# Patient Record
Sex: Male | Born: 1960 | Race: Black or African American | Hispanic: No
Health system: Southern US, Community
[De-identification: ages and names within clinical notes are randomized; demographics above are authoritative.]

---

## 2019-11-17 ENCOUNTER — Emergency Department (HOSPITAL_COMMUNITY): Payer: Self-pay

## 2019-11-17 ENCOUNTER — Encounter (HOSPITAL_COMMUNITY): Payer: Self-pay | Admitting: Emergency Medicine

## 2019-11-17 ENCOUNTER — Emergency Department (HOSPITAL_COMMUNITY)
Admission: EM | Admit: 2019-11-17 | Discharge: 2019-11-17 | Disposition: A | Discharge status: Home / Self Care | Payer: Self-pay | Attending: Emergency Medicine | Admitting: Emergency Medicine

## 2019-11-17 DIAGNOSIS — S2020XA Contusion of thorax, unspecified, initial encounter: Secondary | ICD-10-CM | POA: Diagnosis not present

## 2019-11-17 DIAGNOSIS — Y939 Activity, unspecified: Secondary | ICD-10-CM | POA: Diagnosis not present

## 2019-11-17 DIAGNOSIS — T1490XA Injury, unspecified, initial encounter: Secondary | ICD-10-CM

## 2019-11-17 DIAGNOSIS — M79601 Pain in right arm: Secondary | ICD-10-CM | POA: Insufficient documentation

## 2019-11-17 DIAGNOSIS — R519 Headache, unspecified: Secondary | ICD-10-CM | POA: Diagnosis not present

## 2019-11-17 DIAGNOSIS — M542 Cervicalgia: Secondary | ICD-10-CM | POA: Insufficient documentation

## 2019-11-17 DIAGNOSIS — Y999 Unspecified external cause status: Secondary | ICD-10-CM | POA: Insufficient documentation

## 2019-11-17 DIAGNOSIS — R109 Unspecified abdominal pain: Secondary | ICD-10-CM | POA: Insufficient documentation

## 2019-11-17 DIAGNOSIS — S0990XA Unspecified injury of head, initial encounter: Secondary | ICD-10-CM | POA: Insufficient documentation

## 2019-11-17 DIAGNOSIS — R911 Solitary pulmonary nodule: Secondary | ICD-10-CM

## 2019-11-17 DIAGNOSIS — S24109A Unspecified injury at unspecified level of thoracic spinal cord, initial encounter: Secondary | ICD-10-CM | POA: Diagnosis present

## 2019-11-17 DIAGNOSIS — Y92410 Unspecified street and highway as the place of occurrence of the external cause: Secondary | ICD-10-CM | POA: Diagnosis not present

## 2019-11-17 LAB — COMPREHENSIVE METABOLIC PANEL
ALT: 18 U/L (ref 0–44)
AST: 26 U/L (ref 15–41)
Albumin: 3.5 g/dL (ref 3.5–5.0)
Alkaline Phosphatase: 49 U/L (ref 38–126)
Anion gap: 10 (ref 5–15)
BUN: 9 mg/dL (ref 6–20)
CO2: 29 mmol/L (ref 22–32)
Calcium: 8.8 mg/dL — ABNORMAL LOW (ref 8.9–10.3)
Chloride: 102 mmol/L (ref 98–111)
Creatinine, Ser: 1.24 mg/dL (ref 0.61–1.24)
GFR calc Af Amer: >60 mL/min (ref >60)
GFR calc non Af Amer: >60 mL/min (ref >60)
Glucose, Bld: 129 mg/dL — ABNORMAL HIGH (ref 70–99)
Potassium: 3.9 mmol/L (ref 3.5–5.1)
Sodium: 141 mmol/L (ref 135–145)
Total Bilirubin: 1 mg/dL (ref 0.3–1.2)
Total Protein: 6.1 g/dL — ABNORMAL LOW (ref 6.5–8.1)

## 2019-11-17 LAB — I-STAT CHEM 8, ED
BUN: 10 mg/dL (ref 6–20)
Calcium, Ion: 1.12 mmol/L — ABNORMAL LOW (ref 1.15–1.40)
Chloride: 102 mmol/L (ref 98–111)
Creatinine, Ser: 1.2 mg/dL (ref 0.61–1.24)
Glucose, Bld: 130 mg/dL — ABNORMAL HIGH (ref 70–99)
HCT: 44 % (ref 39.0–52.0)
Hemoglobin: 15 g/dL (ref 13.0–17.0)
Potassium: 3.8 mmol/L (ref 3.5–5.1)
Sodium: 141 mmol/L (ref 135–145)
TCO2: 30 mmol/L (ref 22–32)

## 2019-11-17 LAB — CBC
HCT: 44.2 % (ref 39.0–52.0)
Hemoglobin: 14.4 g/dL (ref 13.0–17.0)
MCH: 30.6 pg (ref 26.0–34.0)
MCHC: 32.6 g/dL (ref 30.0–36.0)
MCV: 94 fL (ref 80.0–100.0)
Platelets: 271 10*3/uL (ref 150–400)
RBC: 4.7 MIL/uL (ref 4.22–5.81)
RDW: 13.1 % (ref 11.5–15.5)
WBC: 3.4 10*3/uL — ABNORMAL LOW (ref 4.0–10.5)
nRBC: 0 % (ref 0.0–0.2)

## 2019-11-17 LAB — SAMPLE TO BLOOD BANK

## 2019-11-17 LAB — PROTIME-INR
INR: 1.1 (ref 0.8–1.2)
Prothrombin Time: 14.5 seconds (ref 11.4–15.2)

## 2019-11-17 LAB — ETHANOL: Alcohol, Ethyl (B): <10 mg/dL (ref <10)

## 2019-11-17 MED ORDER — HYDROCODONE-ACETAMINOPHEN 5-325 MG PO TABS
1.0000 | ORAL_TABLET | ORAL | Status: AC
  Administered 2019-11-17: 1 via ORAL
  Filled 2019-11-17: qty 1

## 2019-11-17 MED ORDER — ONDANSETRON HCL 4 MG/2ML IJ SOLN
4.0000 mg | Freq: Once | INTRAMUSCULAR | Status: AC
  Administered 2019-11-17: 4 mg via INTRAVENOUS
  Filled 2019-11-17: qty 2

## 2019-11-17 MED ORDER — IOHEXOL 300 MG/ML  SOLN
100.0000 mL | Freq: Once | INTRAMUSCULAR | Status: AC | PRN
  Administered 2019-11-17: 100 mL via INTRAVENOUS

## 2019-11-17 MED ORDER — HYDROCODONE-ACETAMINOPHEN 5-325 MG PO TABS: 1.0000 | ORAL_TABLET | ORAL | 0 refills | Status: AC | PRN

## 2019-11-17 MED ORDER — MORPHINE SULFATE (PF) 4 MG/ML IV SOLN
4.0000 mg | Freq: Once | INTRAVENOUS | Status: AC
  Administered 2019-11-17: 4 mg via INTRAVENOUS
  Filled 2019-11-17: qty 1

## 2019-11-17 NOTE — Discharge Instructions (Addendum)
Take ibuprofen as needed for pain.  Apply ice to help with swelling and discomfort.  Take the medications as prescribed.  CT scan showed a 5 mm right lower lobe lung nodule.  If you are smoker, repeat CT scan in 12 months is recommended to make sure this nodule is not growing in size.  If you do not smoke no further follow-up is necessary.

## 2019-11-17 NOTE — ED Notes (Signed)
Taxi voucher provided.
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
## 2019-11-17 NOTE — ED Notes (Signed)
Taken to CT.
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
